# Patient Record
Sex: Female | Born: 1985 | Race: Black or African American | Hispanic: No | Marital: Married | Smoking: Never smoker
Health system: Southern US, Community
[De-identification: ages and names within clinical notes are randomized; demographics above are authoritative.]

## PROBLEM LIST (undated history)

## (undated) DIAGNOSIS — I10 Essential (primary) hypertension: Secondary | ICD-10-CM

---

## 2015-07-03 ENCOUNTER — Emergency Department (HOSPITAL_COMMUNITY): Payer: No Typology Code available for payment source

## 2015-07-03 ENCOUNTER — Emergency Department (HOSPITAL_COMMUNITY)
Admission: EM | Admit: 2015-07-03 | Discharge: 2015-07-03 | Disposition: A | Discharge status: Home / Self Care | Payer: No Typology Code available for payment source | Attending: Emergency Medicine | Admitting: Emergency Medicine

## 2015-07-03 ENCOUNTER — Encounter (HOSPITAL_COMMUNITY): Payer: Self-pay | Admitting: Emergency Medicine

## 2015-07-03 DIAGNOSIS — Y92410 Unspecified street and highway as the place of occurrence of the external cause: Secondary | ICD-10-CM | POA: Diagnosis not present

## 2015-07-03 DIAGNOSIS — M25512 Pain in left shoulder: Secondary | ICD-10-CM

## 2015-07-03 DIAGNOSIS — R55 Syncope and collapse: Secondary | ICD-10-CM | POA: Diagnosis not present

## 2015-07-03 DIAGNOSIS — Y998 Other external cause status: Secondary | ICD-10-CM | POA: Insufficient documentation

## 2015-07-03 DIAGNOSIS — S79912A Unspecified injury of left hip, initial encounter: Secondary | ICD-10-CM | POA: Diagnosis not present

## 2015-07-03 DIAGNOSIS — Z3202 Encounter for pregnancy test, result negative: Secondary | ICD-10-CM | POA: Insufficient documentation

## 2015-07-03 DIAGNOSIS — M25552 Pain in left hip: Secondary | ICD-10-CM

## 2015-07-03 DIAGNOSIS — Z79899 Other long term (current) drug therapy: Secondary | ICD-10-CM | POA: Insufficient documentation

## 2015-07-03 DIAGNOSIS — Y9389 Activity, other specified: Secondary | ICD-10-CM | POA: Diagnosis not present

## 2015-07-03 DIAGNOSIS — S4992XA Unspecified injury of left shoulder and upper arm, initial encounter: Secondary | ICD-10-CM | POA: Diagnosis present

## 2015-07-03 DIAGNOSIS — I10 Essential (primary) hypertension: Secondary | ICD-10-CM | POA: Diagnosis not present

## 2015-07-03 DIAGNOSIS — M62838 Other muscle spasm: Secondary | ICD-10-CM | POA: Diagnosis not present

## 2015-07-03 HISTORY — DX: Essential (primary) hypertension: I10

## 2015-07-03 LAB — I-STAT BETA HCG BLOOD, ED (MC, WL, AP ONLY)

## 2015-07-03 MED ORDER — CYCLOBENZAPRINE HCL 10 MG PO TABS: 10.0000 mg | ORAL_TABLET | Freq: Two times a day (BID) | ORAL | Status: AC | PRN

## 2015-07-03 MED ORDER — HYDROMORPHONE HCL 1 MG/ML IJ SOLN
0.5000 mg | Freq: Once | INTRAMUSCULAR | Status: AC
  Administered 2015-07-03: 0.5 mg via INTRAVENOUS
  Filled 2015-07-03: qty 1

## 2015-07-03 MED ORDER — OXYCODONE-ACETAMINOPHEN 5-325 MG PO TABS
2.0000 | ORAL_TABLET | Freq: Once | ORAL | Status: AC
  Administered 2015-07-03: 2 via ORAL
  Filled 2015-07-03: qty 2
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
## 2015-07-03 NOTE — ED Notes (Signed)
MD at bedside. 
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
